# Patient Record
Sex: Female | Born: 1995 | Race: Black or African American | Hispanic: No | Marital: Single | Smoking: Never smoker
Health system: Southern US, Community
[De-identification: ages and names within clinical notes are randomized; demographics above are authoritative.]

---

## 2018-08-24 ENCOUNTER — Other Ambulatory Visit: Payer: Self-pay

## 2018-08-24 ENCOUNTER — Emergency Department (HOSPITAL_COMMUNITY)
Admission: EM | Admit: 2018-08-24 | Discharge: 2018-08-24 | Disposition: A | Discharge status: Home / Self Care | Payer: Self-pay | Attending: Emergency Medicine | Admitting: Emergency Medicine

## 2018-08-24 ENCOUNTER — Emergency Department (HOSPITAL_COMMUNITY): Payer: Self-pay

## 2018-08-24 ENCOUNTER — Encounter (HOSPITAL_COMMUNITY): Payer: Self-pay | Admitting: Emergency Medicine

## 2018-08-24 DIAGNOSIS — R112 Nausea with vomiting, unspecified: Secondary | ICD-10-CM | POA: Insufficient documentation

## 2018-08-24 DIAGNOSIS — R1030 Lower abdominal pain, unspecified: Secondary | ICD-10-CM | POA: Insufficient documentation

## 2018-08-24 LAB — COMPREHENSIVE METABOLIC PANEL
ALT: 29 U/L (ref 0–44)
AST: 36 U/L (ref 15–41)
Albumin: 4.4 g/dL (ref 3.5–5.0)
Alkaline Phosphatase: 53 U/L (ref 38–126)
Anion gap: 13 (ref 5–15)
BILIRUBIN TOTAL: 1.3 mg/dL — AB (ref 0.3–1.2)
BUN: 13 mg/dL (ref 6–20)
CO2: 23 mmol/L (ref 22–32)
Calcium: 9.2 mg/dL (ref 8.9–10.3)
Chloride: 110 mmol/L (ref 98–111)
Creatinine, Ser: 0.88 mg/dL (ref 0.44–1.00)
GFR calc Af Amer: >60 mL/min (ref >60)
Glucose, Bld: 97 mg/dL (ref 70–99)
Potassium: 3.8 mmol/L (ref 3.5–5.1)
Sodium: 146 mmol/L — ABNORMAL HIGH (ref 135–145)
TOTAL PROTEIN: 7.9 g/dL (ref 6.5–8.1)

## 2018-08-24 LAB — CBC WITH DIFFERENTIAL/PLATELET
BASOS ABS: 0 10*3/uL (ref 0.0–0.1)
BASOS PCT: 0 %
EOS ABS: 0 10*3/uL (ref 0.0–0.7)
EOS PCT: 0 %
HCT: 34.9 % — ABNORMAL LOW (ref 36.0–46.0)
Hemoglobin: 11.3 g/dL — ABNORMAL LOW (ref 12.0–15.0)
LYMPHS PCT: 3 %
Lymphs Abs: 0.4 10*3/uL — ABNORMAL LOW (ref 0.7–4.0)
MCH: 24.7 pg — ABNORMAL LOW (ref 26.0–34.0)
MCHC: 32.4 g/dL (ref 30.0–36.0)
MCV: 76.4 fL — AB (ref 78.0–100.0)
Monocytes Absolute: 0.5 10*3/uL (ref 0.1–1.0)
Monocytes Relative: 3 %
Neutro Abs: 14.8 10*3/uL — ABNORMAL HIGH (ref 1.7–7.7)
Neutrophils Relative %: 94 %
PLATELETS: 342 10*3/uL (ref 150–400)
RBC: 4.57 MIL/uL (ref 3.87–5.11)
RDW: 13.4 % (ref 11.5–15.5)
WBC: 15.7 10*3/uL — AB (ref 4.0–10.5)

## 2018-08-24 LAB — I-STAT BETA HCG BLOOD, ED (MC, WL, AP ONLY)

## 2018-08-24 MED ORDER — PROMETHAZINE HCL 25 MG/ML IJ SOLN
25.0000 mg | Freq: Once | INTRAMUSCULAR | Status: AC
  Administered 2018-08-24: 25 mg via INTRAVENOUS
  Filled 2018-08-24: qty 1

## 2018-08-24 MED ORDER — IOPAMIDOL (ISOVUE-300) INJECTION 61%
INTRAVENOUS | Status: AC
  Filled 2018-08-24: qty 100

## 2018-08-24 MED ORDER — ONDANSETRON HCL 4 MG/2ML IJ SOLN
4.0000 mg | Freq: Once | INTRAMUSCULAR | Status: AC
  Administered 2018-08-24: 4 mg via INTRAVENOUS
  Filled 2018-08-24: qty 2

## 2018-08-24 MED ORDER — ONDANSETRON 4 MG PO TBDP: 4.0000 mg | ORAL_TABLET | Freq: Three times a day (TID) | ORAL | 0 refills | Status: AC | PRN

## 2018-08-24 MED ORDER — SODIUM CHLORIDE 0.9 % IV BOLUS
1000.0000 mL | Freq: Once | INTRAVENOUS | Status: AC
  Administered 2018-08-24: 1000 mL via INTRAVENOUS

## 2018-08-24 MED ORDER — IOPAMIDOL (ISOVUE-300) INJECTION 61%
100.0000 mL | Freq: Once | INTRAVENOUS | Status: AC | PRN
  Administered 2018-08-24: 100 mL via INTRAVENOUS

## 2018-08-24 NOTE — Discharge Instructions (Signed)
Please read instructions below. Drink clear liquids until your stomach feels better. Then, slowly introduce bland foods into your diet as tolerated, such as bread, rice, apples, bananas. You can take zofran every 8 hours as needed for nausea. Follow up with your primary care. Return to the ER for severe abdominal pain, fever, uncontrollable vomiting, or new or concerning symptoms.  

## 2018-08-24 NOTE — ED Provider Notes (Signed)
[facility]-EMERGENCY DEPT Provider Note   CSN: [id number] Arrival date & time: 08/24/18  [id number]     History   Chief Complaint Chief Complaint  Patient presents with  . Nausea  . Emesis  . Alcohol Intoxication    HPI Veronica Navarro is a 22 y.o. female without significant past medical history, presenting to the ED complaining of nausea and vomiting secondary to alcohol intoxication.  She states she was drinking last night with last drink around midnight.  Reports she is unsure of how many drinks she had though states she was drinking a spiked punch as well as as vodka.  States she is only an occasional drinker.  Denies drug use.  Reports associated generalized abdominal pain.  Denies any other associated symptoms.  The history is provided by the patient.    History reviewed. No pertinent past medical history.  There are no active problems to display for this patient.   History reviewed. No pertinent surgical history.   OB History   None      Home Medications    Prior to Admission medications   Medication Sig Start Date End Date Taking? Authorizing Provider  ondansetron (ZOFRAN ODT) 4 MG disintegrating tablet Take 1 tablet (4 mg total) by mouth every 8 (eight) hours as needed for nausea or vomiting. 08/24/18   Gabriell Daigneault, [place] N, PA-C    Family History History reviewed. No pertinent family history.  Social History Social History   Tobacco Use  . Smoking status: Never Smoker  . Smokeless tobacco: Never Used  Substance Use Topics  . Alcohol use: Yes  . Drug use: Never     Allergies   Sulfa antibiotics   Review of Systems Review of Systems  Constitutional: Negative for fever.  Gastrointestinal: Positive for abdominal pain, nausea and vomiting. Negative for constipation and diarrhea.  All other systems reviewed and are negative.    Physical Exam Updated Vital Signs BP 113/70   Pulse (!) 55   Temp 98.1 F (36.7 C) (Oral)   Resp 16    Ht 5\' 7"  (1.702 m)   Wt 63.5 kg   LMP 08/22/2018 Comment: negative HCG blood test 08-24-2018  SpO2 100%   BMI 21.93 kg/m   Physical Exam  Constitutional: She is oriented to person, place, and time. She appears well-developed and well-nourished. No distress.  HENT:  Head: Normocephalic and atraumatic.  Mouth/Throat: Oropharynx is clear and moist.  Eyes: Pupils are equal, round, and reactive to light. Conjunctivae and EOM are normal.  Neck: Normal range of motion. Neck supple.  Cardiovascular: Normal rate, regular rhythm and normal heart sounds.  Pulmonary/Chest: Effort normal and breath sounds normal. No respiratory distress.  Abdominal: Soft. Bowel sounds are normal. She exhibits no distension and no mass. There is tenderness (Generalized though worse in the lower quadrants.). There is no rebound.  Neurological: She is alert and oriented to person, place, and time.  Speech is not slurred  Skin: Skin is warm.  Psychiatric: She has a normal mood and affect. Her behavior is normal.  Nursing note and vitals reviewed.    ED Treatments / Results  Labs (all labs ordered are listed, but only abnormal results are displayed) Labs Reviewed  COMPREHENSIVE METABOLIC PANEL - Abnormal; Notable for the following components:      Result Value   Sodium 146 (*)    Total Bilirubin 1.3 (*)    All other components within normal limits  CBC WITH DIFFERENTIAL/PLATELET - Abnormal; Notable for  the following components:   WBC 15.7 (*)    Hemoglobin 11.3 (*)    HCT 34.9 (*)    MCV 76.4 (*)    MCH 24.7 (*)    Neutro Abs 14.8 (*)    Lymphs Abs 0.4 (*)    All other components within normal limits  I-STAT BETA HCG BLOOD, ED ([facility], [facility], [facility] ONLY)    EKG None  Radiology Ct Abdomen Pelvis W Contrast  Result Date: 08/24/2018 CLINICAL DATA:  Abdominal pain, nausea/vomiting EXAM: CT ABDOMEN AND PELVIS WITH CONTRAST TECHNIQUE: Multidetector CT imaging of the abdomen and pelvis was performed using the  standard protocol following bolus administration of intravenous contrast. CONTRAST:  ISOVUE-300 IOPAMIDOL (ISOVUE-300) INJECTION 61% COMPARISON:  None. FINDINGS: Lower chest: Lung bases are clear. Hepatobiliary: Liver is within normal limits. Gallbladder is unremarkable. No intrahepatic or extrahepatic ductal dilatation. Pancreas: Within normal limits. Spleen: Within normal limits. Adrenals/Urinary Tract: Adrenal glands are within normal limits. Kidneys are within normal limits.  No hydronephrosis. Bladder is within normal limits. Stomach/Bowel: Stomach is within normal limits. No evidence of bowel obstruction. Normal appendix (series 2/image 63). Colon is decompressed although mildly thick-walled, for example at the hepatic flexure (series 2/image 18), raising the possibility of infectious/inflammatory colitis. No pneumatosis.  No free air. Vascular/Lymphatic: No evidence of abdominal aortic aneurysm. No suspicious abdominopelvic lymphadenopathy. Reproductive: Uterus is within normal limits.  No adnexal masses. Other: Small volume pelvic ascites. Musculoskeletal: Visualized osseous structures are within normal limits. IMPRESSION: Colonic wall thickening, raising the possibility of infectious/inflammatory colitis. Associated small volume pelvic ascites. Electronically Signed   By: Charline Bills M.D.   On: 08/24/2018 11:53    Procedures Procedures (including critical care time)  Medications Ordered in ED Medications  iopamidol (ISOVUE-300) 61 % injection (has no administration in time range)  sodium chloride 0.9 % bolus 1,000 mL (0 mLs Intravenous Stopped 08/24/18 0906)  ondansetron (ZOFRAN) injection 4 mg (4 mg Intravenous Given 08/24/18 0704)  promethazine (PHENERGAN) injection 25 mg (25 mg Intravenous Given 08/24/18 0859)  iopamidol (ISOVUE-300) 61 % injection 100 mL (100 mLs Intravenous Contrast Given 08/24/18 1124)  ondansetron (ZOFRAN) injection 4 mg (4 mg Intravenous Given 08/24/18 1316)      Initial Impression / Assessment and Plan / ED Course  I have reviewed the triage vital signs and the nursing notes.  Pertinent labs & imaging results that were available during my care of the patient were reviewed by me and considered in my medical decision making (see chart for details).  Clinical Course as of Aug 24 1501  Wynelle Link Aug 24, 2018  1324 Patient reevaluated, and reports no improvement in nausea or vomiting though patient is observed drinking soda.  Instructed patient to avoid anything by mouth.  Will re-dose for nausea and vomiting.  Patient reports she just finished her menstrual cycle 2 days ago.   [JR]    Clinical Course User Index [JR] Glenette Bookwalter, [place] N, PA-C    Patient presenting to the ED with nausea, vomiting, and abdominal pain after drinking alcohol last night.  On exam, abdomen is generally tender though with more tenderness to the lower quadrants with some associated guarding.  Vital signs are normal.  Patient initially treated with IV fluids and Zofran though on reevaluation patient is actively vomiting.  Nausea re-treated without significant improvement.  Repeat abdominal exam persists with tenderness and some guarding.  Given patient in the ED for multiple hours without improvement, labs ordered and CT scan to evaluate for possible  acute intra-abdominal pathology.  CT scan revealing colitis able to be inflammatory versus infectious.  Labs with leukocytosis and signs of mild dehydration.  Negative pregnancy test.  On reevaluation, patient's nausea has improved and she is tolerating p.o. Fluids. No recent travel or drinking from questionable water sources.  Discussed symptomatic management of likely viral etiology of colitis.  Patient agreeable to plan to discharge with symptomatic management and PCP follow-up.  Patient is well-appearing and safe for discharge at this time.  Patient discussed with Dr. Effie Shy, who agrees with care plan.  Discussed results, findings,  treatment and follow up. Patient advised of return precautions. Patient verbalized understanding and agreed with plan.  Final Clinical Impressions(s) / ED Diagnoses   Final diagnoses:  Nausea and vomiting in adult    ED Discharge Orders         Ordered    ondansetron (ZOFRAN ODT) 4 MG disintegrating tablet  Every 8 hours PRN     08/24/18 1309           Alfio Loescher, [place] N, [place] 08/24/18 1502    Devoria Albe, MD 08/24/18 2259

## 2020-02-10 IMAGING — CT CT ABD-PELV W/ CM
2 of 4 series · 16 of 46 positions shown, 18 images · IV contrast (ISOVUE)
Comparison: None.

CLINICAL DATA: Abdominal pain, nausea/vomiting

EXAM:
CT ABDOMEN AND PELVIS WITH CONTRAST
TECHNIQUE: Multidetector CT imaging of the abdomen and pelvis was performed
using the standard protocol following bolus administration of
intravenous contrast.
CONTRAST:  100mL 1PMLAE-6NN IOPAMIDOL (1PMLAE-6NN) INJECTION 61%

[Series 2: axial st · axial · 0.67mm/px · z∈[-598,-198]mm · 13 of 92 slices shown, 15 images]
[im 6/92  soft-tissue]
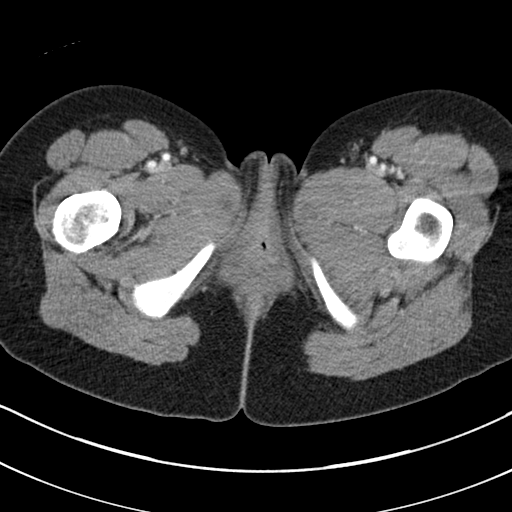
[im 6/92  bone]
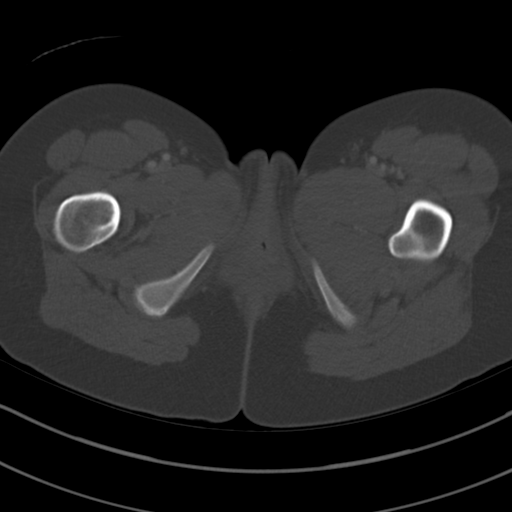
[im 11/92  soft-tissue]
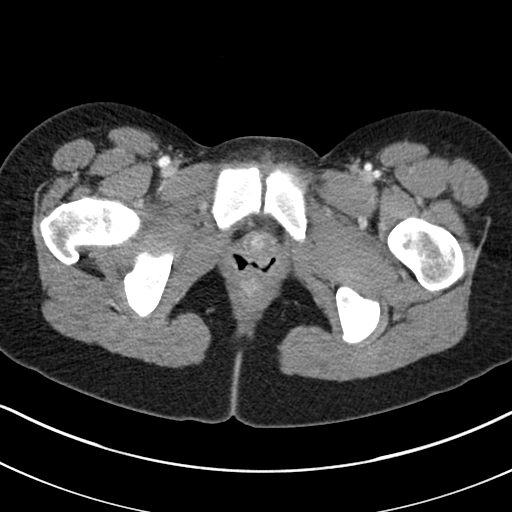
[im 21/92  soft-tissue]
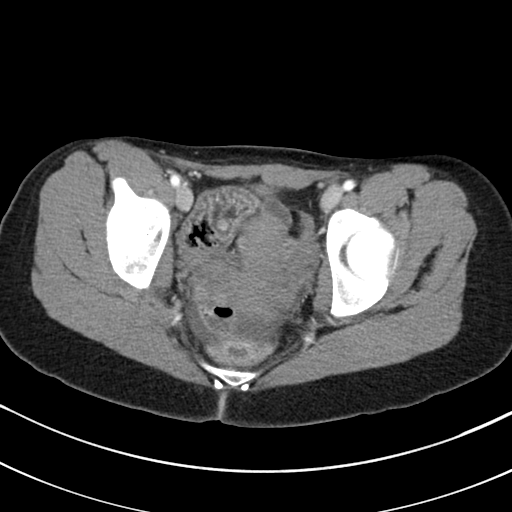
[im 26/92  soft-tissue]
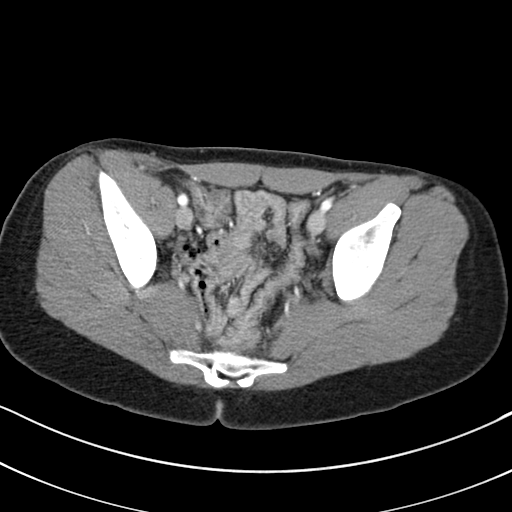
[im 31/92  soft-tissue]
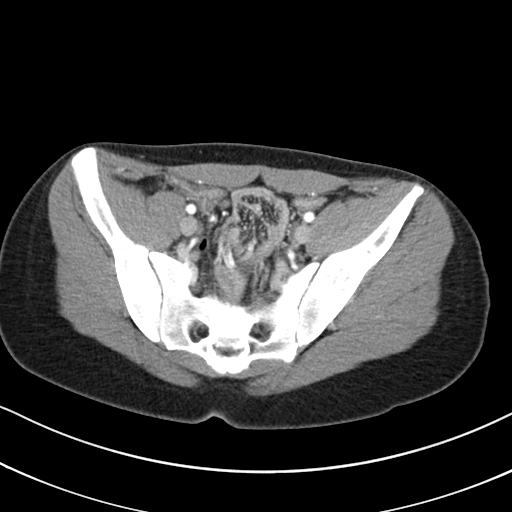
[im 41/92  soft-tissue]
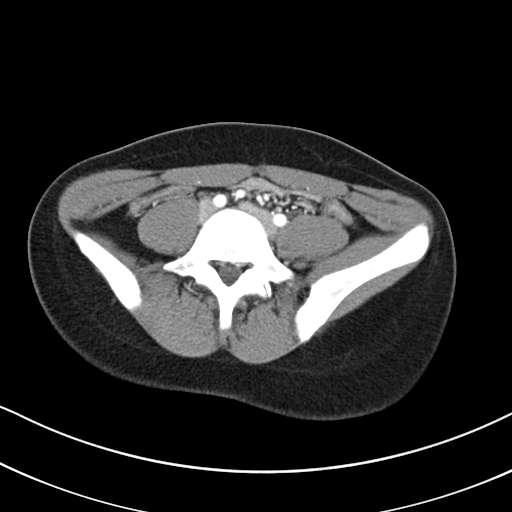
[im 46/92  soft-tissue]
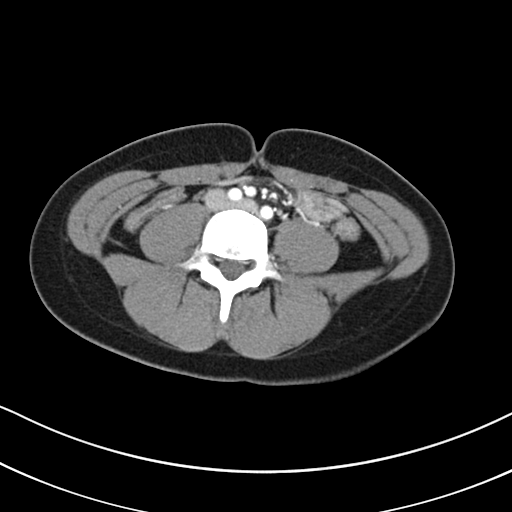
[im 51/92  soft-tissue]
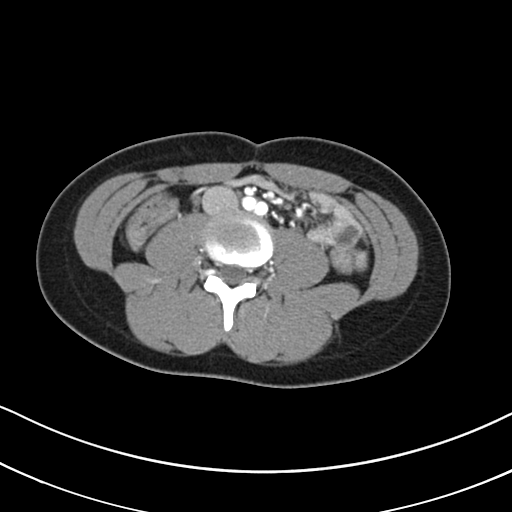
[im 61/92  soft-tissue]
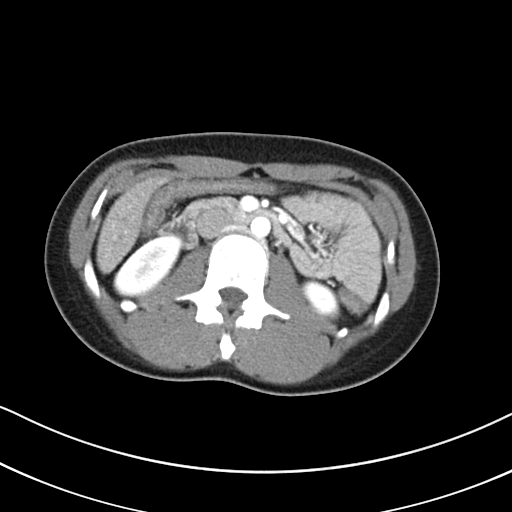
[im 61/92  bone]
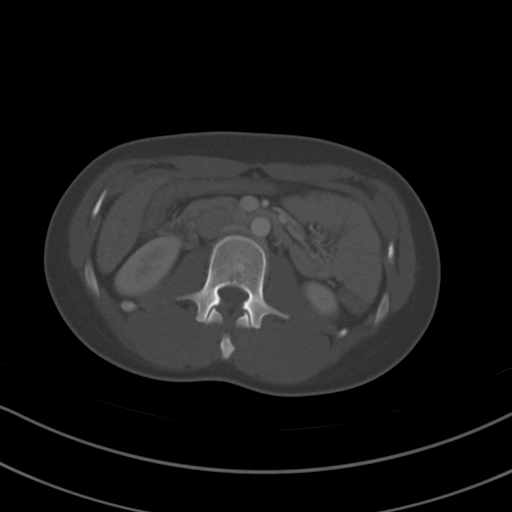
[im 66/92  soft-tissue]
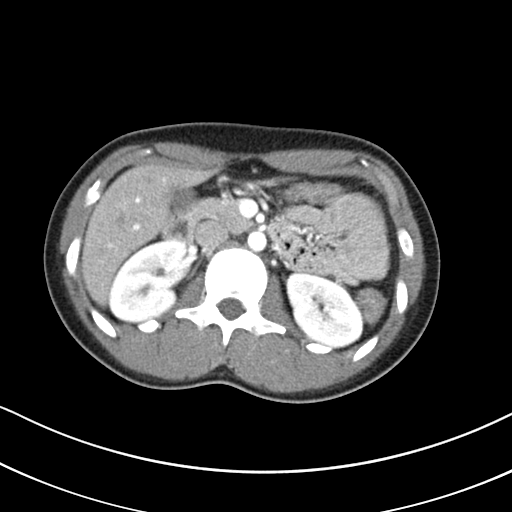
[im 71/92  soft-tissue]
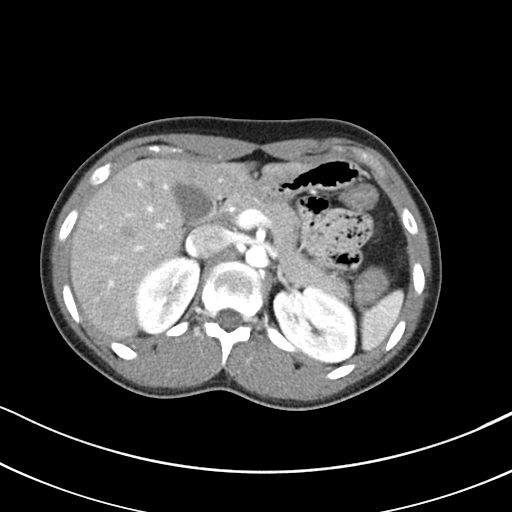
[im 81/92  soft-tissue]
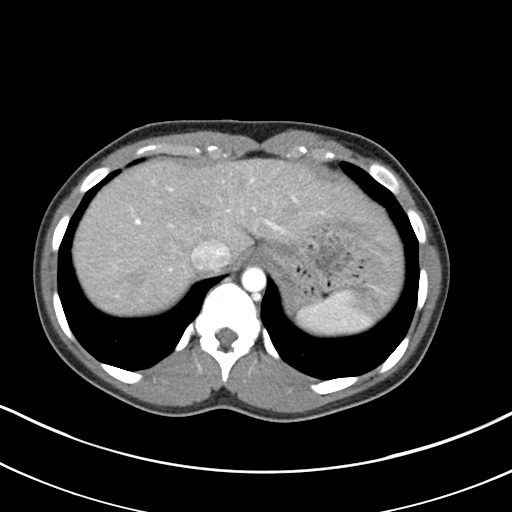
[im 86/92  soft-tissue]
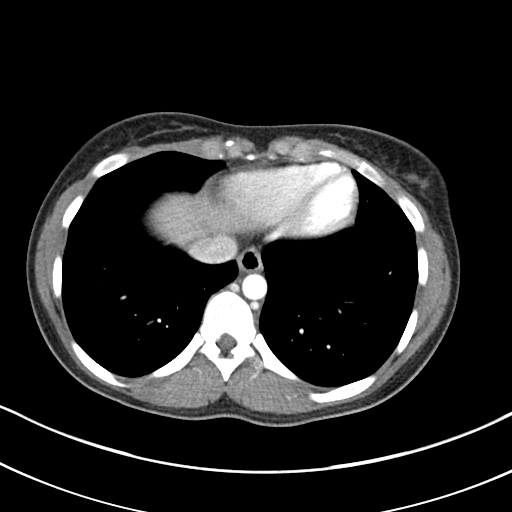

[Series 4: coronal st · coronal · 0.76mm/px · 3 of 75 slices shown]
[im 25/75  soft-tissue]
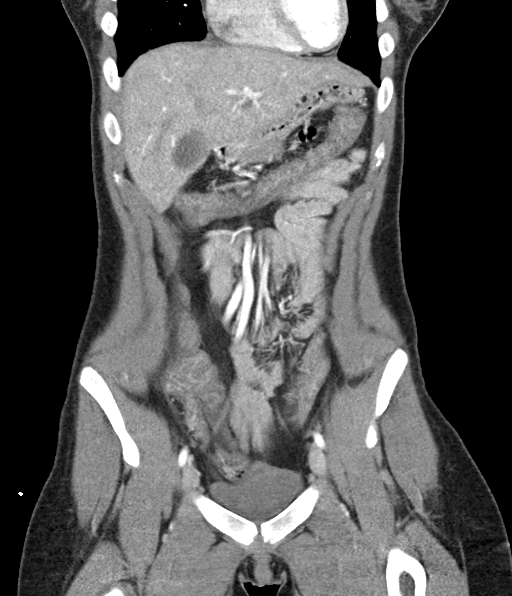
[im 33/75  soft-tissue]
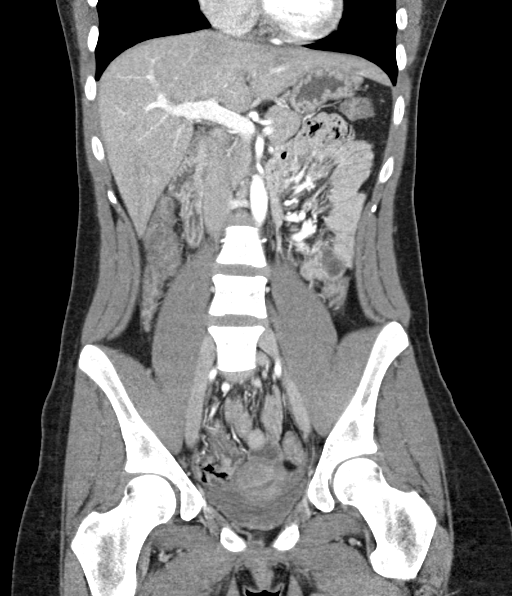
[im 42/75  soft-tissue]
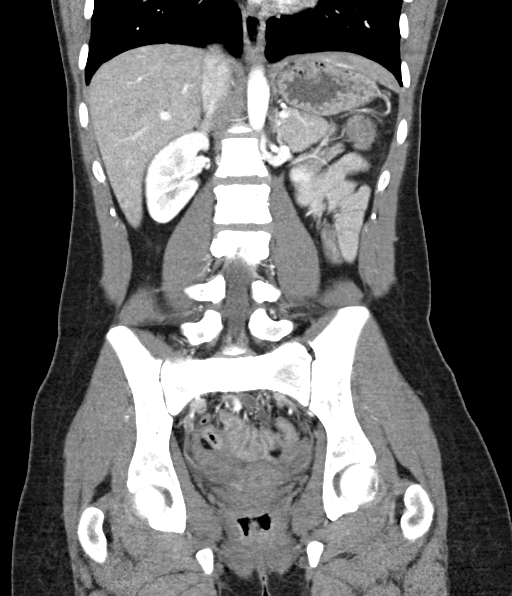

[16 of 46 positions shown; findings below may reference images not displayed]

FINDINGS: Lower chest: Lung bases are clear.

Hepatobiliary: Liver is within normal limits.

Gallbladder is unremarkable. No intrahepatic or extrahepatic ductal
dilatation.

Pancreas: Within normal limits.

Spleen: Within normal limits.

Adrenals/Urinary Tract: Adrenal glands are within normal limits.

Kidneys are within normal limits.  No hydronephrosis.

Bladder is within normal limits.

Stomach/Bowel: Stomach is within normal limits.

No evidence of bowel obstruction.

Normal appendix (series 2/image 63).

Colon is decompressed although mildly thick-walled, for example at
the hepatic flexure (series 2/image 18), raising the possibility of
infectious/inflammatory colitis.

No pneumatosis.  No free air.

Vascular/Lymphatic: No evidence of abdominal aortic aneurysm.

No suspicious abdominopelvic lymphadenopathy.

Reproductive: Uterus is within normal limits.  No adnexal masses.

Other: Small volume pelvic ascites.

Musculoskeletal: Visualized osseous structures are within normal
limits.
IMPRESSION: Colonic wall thickening, raising the possibility of
infectious/inflammatory colitis.

Associated small volume pelvic ascites.
